# Patient Record
Sex: Male | Born: 1970 | Hispanic: No | Marital: Married | Smoking: Never smoker
Health system: Southern US, Community
[De-identification: ages and names within clinical notes are randomized; demographics above are authoritative.]

## PROBLEM LIST (undated history)

## (undated) DIAGNOSIS — J45909 Unspecified asthma, uncomplicated: Secondary | ICD-10-CM

---

## 2013-06-06 ENCOUNTER — Emergency Department (HOSPITAL_COMMUNITY): Payer: Managed Care, Other (non HMO)

## 2013-06-06 ENCOUNTER — Inpatient Hospital Stay (HOSPITAL_COMMUNITY): Payer: Managed Care, Other (non HMO)

## 2013-06-06 ENCOUNTER — Inpatient Hospital Stay (HOSPITAL_COMMUNITY)
Admission: EM | Admit: 2013-06-06 | Discharge: 2013-06-10 | DRG: 956 | Payer: Managed Care, Other (non HMO) | Attending: General Surgery | Admitting: General Surgery

## 2013-06-06 ENCOUNTER — Encounter (HOSPITAL_COMMUNITY): Payer: Self-pay | Admitting: Emergency Medicine

## 2013-06-06 DIAGNOSIS — M25569 Pain in unspecified knee: Secondary | ICD-10-CM | POA: Diagnosis present

## 2013-06-06 DIAGNOSIS — D62 Acute posthemorrhagic anemia: Secondary | ICD-10-CM | POA: Diagnosis not present

## 2013-06-06 DIAGNOSIS — S7290XA Unspecified fracture of unspecified femur, initial encounter for closed fracture: Secondary | ICD-10-CM

## 2013-06-06 DIAGNOSIS — S139XXA Sprain of joints and ligaments of unspecified parts of neck, initial encounter: Secondary | ICD-10-CM | POA: Diagnosis present

## 2013-06-06 DIAGNOSIS — S27899A Unspecified injury of other specified intrathoracic organs, initial encounter: Secondary | ICD-10-CM | POA: Diagnosis present

## 2013-06-06 DIAGNOSIS — Y92410 Unspecified street and highway as the place of occurrence of the external cause: Secondary | ICD-10-CM

## 2013-06-06 DIAGNOSIS — S36115A Moderate laceration of liver, initial encounter: Secondary | ICD-10-CM | POA: Diagnosis present

## 2013-06-06 DIAGNOSIS — S72309A Unspecified fracture of shaft of unspecified femur, initial encounter for closed fracture: Secondary | ICD-10-CM | POA: Diagnosis present

## 2013-06-06 DIAGNOSIS — S2249XA Multiple fractures of ribs, unspecified side, initial encounter for closed fracture: Secondary | ICD-10-CM | POA: Diagnosis present

## 2013-06-06 DIAGNOSIS — S27329A Contusion of lung, unspecified, initial encounter: Secondary | ICD-10-CM | POA: Diagnosis present

## 2013-06-06 DIAGNOSIS — S36113A Laceration of liver, unspecified degree, initial encounter: Secondary | ICD-10-CM | POA: Diagnosis present

## 2013-06-06 DIAGNOSIS — S2241XA Multiple fractures of ribs, right side, initial encounter for closed fracture: Secondary | ICD-10-CM | POA: Diagnosis present

## 2013-06-06 DIAGNOSIS — J45909 Unspecified asthma, uncomplicated: Secondary | ICD-10-CM | POA: Diagnosis present

## 2013-06-06 DIAGNOSIS — S7292XA Unspecified fracture of left femur, initial encounter for closed fracture: Secondary | ICD-10-CM | POA: Diagnosis present

## 2013-06-06 DIAGNOSIS — R52 Pain, unspecified: Secondary | ICD-10-CM | POA: Diagnosis present

## 2013-06-06 DIAGNOSIS — S27322A Contusion of lung, bilateral, initial encounter: Secondary | ICD-10-CM | POA: Diagnosis present

## 2013-06-06 HISTORY — DX: Unspecified asthma, uncomplicated: J45.909

## 2013-06-06 LAB — COMPREHENSIVE METABOLIC PANEL
ALBUMIN: 3.2 g/dL — AB (ref 3.5–5.2)
ALT: 205 U/L — ABNORMAL HIGH (ref 0–53)
AST: 231 U/L — ABNORMAL HIGH (ref 0–37)
Alkaline Phosphatase: 68 U/L (ref 39–117)
BILIRUBIN TOTAL: 0.5 mg/dL (ref 0.3–1.2)
BUN: 17 mg/dL (ref 6–23)
CHLORIDE: 102 meq/L (ref 96–112)
CO2: 22 mEq/L (ref 19–32)
CREATININE: 1.12 mg/dL (ref 0.50–1.35)
Calcium: 8.1 mg/dL — ABNORMAL LOW (ref 8.4–10.5)
GFR calc Af Amer: >90 mL/min (ref >90)
GFR calc non Af Amer: 80 mL/min — ABNORMAL LOW (ref >90)
GLUCOSE: 164 mg/dL — AB (ref 70–99)
POTASSIUM: 3.4 meq/L — AB (ref 3.7–5.3)
Sodium: 137 mEq/L (ref 137–147)
Total Protein: 5.8 g/dL — ABNORMAL LOW (ref 6.0–8.3)

## 2013-06-06 LAB — CBC WITH DIFFERENTIAL/PLATELET
BASOS ABS: 0 10*3/uL (ref 0.0–0.1)
BASOS PCT: 0 % (ref 0–1)
Basophils Absolute: 0 10*3/uL (ref 0.0–0.1)
Basophils Relative: 0 % (ref 0–1)
Eosinophils Absolute: 0.2 10*3/uL (ref 0.0–0.7)
Eosinophils Absolute: 0 10*3/uL (ref 0.0–0.7)
Eosinophils Relative: 1 % (ref 0–5)
Eosinophils Relative: 0 % (ref 0–5)
HCT: 35.4 % — ABNORMAL LOW (ref 39.0–52.0)
HEMATOCRIT: 39.8 % (ref 39.0–52.0)
Hemoglobin: 13.5 g/dL (ref 13.0–17.0)
Hemoglobin: 12 g/dL — ABNORMAL LOW (ref 13.0–17.0)
Lymphocytes Relative: 32 % (ref 12–46)
Lymphocytes Relative: 10 % — ABNORMAL LOW (ref 12–46)
Lymphs Abs: 4.1 10*3/uL — ABNORMAL HIGH (ref 0.7–4.0)
Lymphs Abs: 1.1 10*3/uL (ref 0.7–4.0)
MCH: 29.4 pg (ref 26.0–34.0)
MCH: 29.1 pg (ref 26.0–34.0)
MCHC: 33.9 g/dL (ref 30.0–36.0)
MCHC: 33.9 g/dL (ref 30.0–36.0)
MCV: 86.7 fL (ref 78.0–100.0)
MCV: 85.9 fL (ref 78.0–100.0)
MONO ABS: 0.3 10*3/uL (ref 0.1–1.0)
MONOS PCT: 2 % — AB (ref 3–12)
Monocytes Absolute: 0.8 10*3/uL (ref 0.1–1.0)
Monocytes Relative: 7 % (ref 3–12)
NEUTROS ABS: 9.3 10*3/uL — AB (ref 1.7–7.7)
NEUTROS PCT: 64 % (ref 43–77)
NEUTROS PCT: 83 % — AB (ref 43–77)
Neutro Abs: 8.2 10*3/uL — ABNORMAL HIGH (ref 1.7–7.7)
Platelets: 218 10*3/uL (ref 150–400)
Platelets: 194 10*3/uL (ref 150–400)
RBC: 4.59 MIL/uL (ref 4.22–5.81)
RBC: 4.12 MIL/uL — ABNORMAL LOW (ref 4.22–5.81)
RDW: 12.1 % (ref 11.5–15.5)
RDW: 12.1 % (ref 11.5–15.5)
WBC: 12.8 10*3/uL — ABNORMAL HIGH (ref 4.0–10.5)
WBC: 11.2 10*3/uL — AB (ref 4.0–10.5)

## 2013-06-06 LAB — I-STAT TROPONIN, ED
TROPONIN I, POC: 0.39 ng/mL — AB (ref 0.00–0.08)
TROPONIN I, POC: 0.35 ng/mL — AB (ref 0.00–0.08)

## 2013-06-06 LAB — SURGICAL PCR SCREEN
MRSA, PCR: NEGATIVE
STAPHYLOCOCCUS AUREUS: NEGATIVE

## 2013-06-06 LAB — PROTIME-INR
INR: 1.1 (ref 0.00–1.49)
PROTHROMBIN TIME: 14 s (ref 11.6–15.2)

## 2013-06-06 LAB — URINALYSIS, ROUTINE W REFLEX MICROSCOPIC
Bilirubin Urine: NEGATIVE
GLUCOSE, UA: 100 mg/dL — AB
Ketones, ur: NEGATIVE mg/dL
LEUKOCYTES UA: NEGATIVE
Nitrite: NEGATIVE
PROTEIN: NEGATIVE mg/dL
Specific Gravity, Urine: 1.018 (ref 1.005–1.030)
UROBILINOGEN UA: 0.2 mg/dL (ref 0.0–1.0)
pH: 6.5 (ref 5.0–8.0)

## 2013-06-06 LAB — TYPE AND SCREEN
ABO/RH(D): B POS
Antibody Screen: NEGATIVE

## 2013-06-06 LAB — URINE MICROSCOPIC-ADD ON

## 2013-06-06 LAB — ABO/RH: ABO/RH(D): B POS

## 2013-06-06 LAB — APTT: aPTT: 27 seconds (ref 24–37)

## 2013-06-06 MED ORDER — IOHEXOL 300 MG/ML  SOLN
100.0000 mL | Freq: Once | INTRAMUSCULAR | Status: AC | PRN
  Administered 2013-06-06: 100 mL via INTRAVENOUS

## 2013-06-06 MED ORDER — SODIUM CHLORIDE 0.9 % IJ SOLN: 9.0000 mL | INTRAMUSCULAR | Status: DC | PRN

## 2013-06-06 MED ORDER — SODIUM CHLORIDE 0.9 % IV BOLUS (SEPSIS)
1000.0000 mL | Freq: Once | INTRAVENOUS | Status: AC
  Administered 2013-06-06: 1000 mL via INTRAVENOUS

## 2013-06-06 MED ORDER — FENTANYL CITRATE 0.05 MG/ML IJ SOLN
INTRAMUSCULAR | Status: AC
  Filled 2013-06-06: qty 2

## 2013-06-06 MED ORDER — IOHEXOL 300 MG/ML  SOLN
80.0000 mL | Freq: Once | INTRAMUSCULAR | Status: AC | PRN
  Administered 2013-06-06: 70 mL via INTRAVENOUS

## 2013-06-06 MED ORDER — HYDROMORPHONE 0.3 MG/ML IV SOLN
INTRAVENOUS | Status: DC
  Administered 2013-06-06: 0.9 mg via INTRAVENOUS
  Administered 2013-06-06: 15:00:00 via INTRAVENOUS
  Administered 2013-06-06: 1.2 mg via INTRAVENOUS
  Administered 2013-06-07: 0.3 mg via INTRAVENOUS
  Administered 2013-06-07: 0.9 mg via INTRAVENOUS
  Administered 2013-06-07: 3.84 mg via INTRAVENOUS
  Administered 2013-06-07 (×2): 0.6 mg via INTRAVENOUS
  Administered 2013-06-07: 11:00:00 via INTRAVENOUS
  Administered 2013-06-08: 0.9 mg via INTRAVENOUS
  Administered 2013-06-08: 0.495 mg via INTRAVENOUS
  Administered 2013-06-08: 3.9 mg via INTRAVENOUS
  Filled 2013-06-06 (×3): qty 25

## 2013-06-06 MED ORDER — FENTANYL CITRATE 0.05 MG/ML IJ SOLN
100.0000 ug | Freq: Once | INTRAMUSCULAR | Status: AC
  Administered 2013-06-06: 100 ug via INTRAVENOUS

## 2013-06-06 MED ORDER — HYDROMORPHONE HCL PF 1 MG/ML IJ SOLN
1.0000 mg | INTRAMUSCULAR | Status: DC | PRN
  Filled 2013-06-06: qty 1

## 2013-06-06 MED ORDER — FENTANYL CITRATE 0.05 MG/ML IJ SOLN
100.0000 ug | Freq: Once | INTRAMUSCULAR | Status: AC
  Administered 2013-06-06: 100 ug via INTRAVENOUS
  Filled 2013-06-06: qty 2

## 2013-06-06 MED ORDER — NALOXONE HCL 0.4 MG/ML IJ SOLN: 0.4000 mg | INTRAMUSCULAR | Status: DC | PRN

## 2013-06-06 NOTE — ED Notes (Signed)
Patient returned from CT

## 2013-06-06 NOTE — ED Notes (Signed)
Pt placed on 2L 02, pt's 02 sats reading 92% on RA. Pt's 02 sats reading 100% on 2L 02.

## 2013-06-06 NOTE — ED Notes (Signed)
Patient transported to CT 

## 2013-06-06 NOTE — Progress Notes (Signed)
Patient ID: Jonathan SaversMohammed Nadeem Amenta, male   DOB: 05/15/1970, 43 y.o.   MRN: [id number] CT chest reviewed. Also has R rib FXs 3-6. Violeta GelinasBurke Charleen Madera, MD, MPH, FACS Trauma: [phone] General Surgery: [phone]

## 2013-06-06 NOTE — Progress Notes (Signed)
Chaplains received referral to follow up with patient and his family from overnight chaplain. Chaplains providing spiritual and emotional support to patient's brothers who arrived earlier today from [place]. Also, following patient's family members also admitted for medical treatment. [facility]. Will continue to monitor, refer to unit chaplain for follow up. Chaplain Clarita LeberCaroline Crawford was also providing care.   Maurene CapesHillary D Irusta [phone]

## 2013-06-06 NOTE — Progress Notes (Signed)
Chaplain responded to page from nurse that pt was looking for update regarding his wife.  Chaplain could not provide that information but did visit with the pt.  Pt seemed in pain but was responsive to questioning. Chaplain assessed the situation but there was little room for spiritual support in this circumstance.  However, chaplain services will be available in the future if needed.
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
## 2013-06-06 NOTE — Progress Notes (Signed)
Surgery scheduled for 3/10 at 0330pm
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
## 2013-06-06 NOTE — ED Notes (Signed)
Results of troponin given to Dr. Wentz 

## 2013-06-06 NOTE — ED Notes (Signed)
Pt back in room.
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
## 2013-06-06 NOTE — ED Notes (Signed)
Dr. Yates at BS 

## 2013-06-06 NOTE — ED Notes (Signed)
Dr. Wentz at bedside. 

## 2013-06-06 NOTE — Progress Notes (Signed)
Responded to page from nurse indicating that patient sister Leward Quan(Afshan Oriley)  was at bedside and want to locate and see all her family. I visited with patient but patient was in and out with sleep. Patient is scheduled for surgery on tomorrow. Patient's Older brother requested that nurse inform doctor that Mr. Marnette Burgessziz  Will need a note for work to inform Pt.'s job of his whereabout and medical status.  Made calls to all units of other family and arranged visits for family. Escorted  Pt.'s sister and family to visit with other pt.'s.  Provided emotional support, presence, empathic listening and guidance  to pt.'s family. Promoted information sharing between staff and family. Supported Haematologiststaff. Will pass on to unit Chaplain in A.M. For continued support. Will follow as needed.  06/06/13 2300  Clinical Encounter Type  Visited With Patient;Patient and family together;Health care provider  Visit Type Follow-up;Social support;Pre-op;Other (Comment)  Referral From (Support to sister)  Spiritual Encounters  Spiritual Needs Emotional (Guidance)  Stress Factors  Patient Stress Factors None identified  Family Stress Factors Exhausted;Family relationships;Health changes;Major life changes  Venida JarvisWatlington, Myrl Bynum, [place], pager [phone]
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
## 2013-06-06 NOTE — ED Notes (Signed)
Pt transported to CT with this RN 
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
## 2013-06-06 NOTE — Progress Notes (Signed)
UR completed.  Mariko Nowakowski, RN BSN [facility] CCM Trauma/Neuro ICU Case Manager [phone]  

## 2013-06-07 ENCOUNTER — Inpatient Hospital Stay (HOSPITAL_COMMUNITY): Payer: Managed Care, Other (non HMO)

## 2013-06-07 ENCOUNTER — Encounter (HOSPITAL_COMMUNITY): Payer: Self-pay | Admitting: Anesthesiology

## 2013-06-07 ENCOUNTER — Encounter (HOSPITAL_COMMUNITY): Payer: Managed Care, Other (non HMO) | Admitting: Anesthesiology

## 2013-06-07 ENCOUNTER — Inpatient Hospital Stay (HOSPITAL_COMMUNITY): Payer: Managed Care, Other (non HMO) | Admitting: Anesthesiology

## 2013-06-07 ENCOUNTER — Encounter (HOSPITAL_COMMUNITY)
Admission: EM | Disposition: A | Discharge status: Other Acute Inpatient Hospital | Payer: Self-pay | Source: Home / Self Care

## 2013-06-07 DIAGNOSIS — R52 Pain, unspecified: Secondary | ICD-10-CM | POA: Diagnosis not present

## 2013-06-07 DIAGNOSIS — S72309A Unspecified fracture of shaft of unspecified femur, initial encounter for closed fracture: Secondary | ICD-10-CM | POA: Diagnosis not present

## 2013-06-07 HISTORY — PX: FEMUR IM NAIL: SHX1597

## 2013-06-07 LAB — COMPREHENSIVE METABOLIC PANEL
ALT: 133 U/L — ABNORMAL HIGH (ref 0–53)
AST: 75 U/L — AB (ref 0–37)
Albumin: 2.9 g/dL — ABNORMAL LOW (ref 3.5–5.2)
Alkaline Phosphatase: 59 U/L (ref 39–117)
BUN: 11 mg/dL (ref 6–23)
CALCIUM: 8.2 mg/dL — AB (ref 8.4–10.5)
CO2: 23 mEq/L (ref 19–32)
CREATININE: 0.98 mg/dL (ref 0.50–1.35)
Chloride: 101 mEq/L (ref 96–112)
GFR calc Af Amer: >90 mL/min (ref >90)
GFR calc non Af Amer: >90 mL/min (ref >90)
Glucose, Bld: 109 mg/dL — ABNORMAL HIGH (ref 70–99)
Potassium: 3.9 mEq/L (ref 3.7–5.3)
Sodium: 135 mEq/L — ABNORMAL LOW (ref 137–147)
TOTAL PROTEIN: 5.6 g/dL — AB (ref 6.0–8.3)
Total Bilirubin: 1.5 mg/dL — ABNORMAL HIGH (ref 0.3–1.2)

## 2013-06-07 LAB — CBC
HCT: 36.3 % — ABNORMAL LOW (ref 39.0–52.0)
HCT: 34.4 % — ABNORMAL LOW (ref 39.0–52.0)
HEMOGLOBIN: 12.1 g/dL — AB (ref 13.0–17.0)
Hemoglobin: 11.5 g/dL — ABNORMAL LOW (ref 13.0–17.0)
MCH: 29.2 pg (ref 26.0–34.0)
MCH: 29.1 pg (ref 26.0–34.0)
MCHC: 33.3 g/dL (ref 30.0–36.0)
MCHC: 33.4 g/dL (ref 30.0–36.0)
MCV: 87.7 fL (ref 78.0–100.0)
MCV: 87.1 fL (ref 78.0–100.0)
PLATELETS: 143 10*3/uL — AB (ref 150–400)
Platelets: 156 10*3/uL (ref 150–400)
RBC: 4.14 MIL/uL — ABNORMAL LOW (ref 4.22–5.81)
RBC: 3.95 MIL/uL — ABNORMAL LOW (ref 4.22–5.81)
RDW: 12.3 % (ref 11.5–15.5)
RDW: 12 % (ref 11.5–15.5)
WBC: 8.4 10*3/uL (ref 4.0–10.5)
WBC: 9.1 10*3/uL (ref 4.0–10.5)

## 2013-06-07 SURGERY — INSERTION, INTRAMEDULLARY ROD, FEMUR, RETROGRADE
Anesthesia: General | Site: Leg Upper | Laterality: Left

## 2013-06-07 MED ORDER — ACETAMINOPHEN 160 MG/5ML PO SOLN
325.0000 mg | ORAL | Status: DC | PRN
  Filled 2013-06-07: qty 20.3

## 2013-06-07 MED ORDER — METOCLOPRAMIDE HCL 5 MG PO TABS
5.0000 mg | ORAL_TABLET | Freq: Three times a day (TID) | ORAL | Status: DC | PRN
  Filled 2013-06-07: qty 2

## 2013-06-07 MED ORDER — LIDOCAINE HCL (CARDIAC) 20 MG/ML IV SOLN
INTRAVENOUS | Status: DC | PRN
  Administered 2013-06-07: 70 mg via INTRAVENOUS

## 2013-06-07 MED ORDER — ONDANSETRON HCL 4 MG/2ML IJ SOLN: 4.0000 mg | Freq: Four times a day (QID) | INTRAMUSCULAR | Status: DC | PRN

## 2013-06-07 MED ORDER — ROPIVACAINE HCL 5 MG/ML IJ SOLN
INTRAMUSCULAR | Status: DC | PRN
  Administered 2013-06-07: 25 mL via PERINEURAL

## 2013-06-07 MED ORDER — CEFAZOLIN SODIUM-DEXTROSE 2-3 GM-% IV SOLR
INTRAVENOUS | Status: DC | PRN
  Administered 2013-06-07: 2 g via INTRAVENOUS

## 2013-06-07 MED ORDER — MIDAZOLAM HCL 2 MG/2ML IJ SOLN
INTRAMUSCULAR | Status: AC
  Filled 2013-06-07: qty 2

## 2013-06-07 MED ORDER — ONDANSETRON HCL 4 MG/2ML IJ SOLN
INTRAMUSCULAR | Status: DC | PRN
  Administered 2013-06-07: 4 mg via INTRAVENOUS

## 2013-06-07 MED ORDER — LACTATED RINGERS IV SOLN
INTRAVENOUS | Status: DC | PRN
  Administered 2013-06-07 (×3): via INTRAVENOUS

## 2013-06-07 MED ORDER — PANTOPRAZOLE SODIUM 40 MG PO TBEC
40.0000 mg | DELAYED_RELEASE_TABLET | Freq: Every day | ORAL | Status: DC
Start: 2013-06-07 — End: 2013-06-08
  Administered 2013-06-08: 40 mg via ORAL

## 2013-06-07 MED ORDER — ROCURONIUM BROMIDE 100 MG/10ML IV SOLN
INTRAVENOUS | Status: DC | PRN
  Administered 2013-06-07: 50 mg via INTRAVENOUS

## 2013-06-07 MED ORDER — PANTOPRAZOLE SODIUM 40 MG IV SOLR
40.0000 mg | Freq: Every day | INTRAVENOUS | Status: DC
  Administered 2013-06-07: 40 mg via INTRAVENOUS
  Filled 2013-06-07 (×2): qty 40

## 2013-06-07 MED ORDER — LACTATED RINGERS IV SOLN
INTRAVENOUS | Status: DC
  Administered 2013-06-07: 15:00:00 via INTRAVENOUS

## 2013-06-07 MED ORDER — FENTANYL CITRATE 0.05 MG/ML IJ SOLN
INTRAMUSCULAR | Status: DC | PRN
  Administered 2013-06-07: 100 ug via INTRAVENOUS

## 2013-06-07 MED ORDER — PHENYLEPHRINE HCL 10 MG/ML IJ SOLN
INTRAMUSCULAR | Status: DC | PRN
  Administered 2013-06-07: 80 ug via INTRAVENOUS

## 2013-06-07 MED ORDER — SODIUM CHLORIDE 0.9 % IV BOLUS (SEPSIS)
500.0000 mL | Freq: Once | INTRAVENOUS | Status: AC
  Administered 2013-06-07: 500 mL via INTRAVENOUS

## 2013-06-07 MED ORDER — FENTANYL CITRATE 0.05 MG/ML IJ SOLN
INTRAMUSCULAR | Status: AC
  Filled 2013-06-07: qty 5

## 2013-06-07 MED ORDER — NEOSTIGMINE METHYLSULFATE 1 MG/ML IJ SOLN
INTRAMUSCULAR | Status: DC | PRN
  Administered 2013-06-07: 4 mg via INTRAVENOUS

## 2013-06-07 MED ORDER — ASPIRIN 325 MG PO TABS
325.0000 mg | ORAL_TABLET | Freq: Every day | ORAL | Status: DC
  Administered 2013-06-08 – 2013-06-10 (×3): 325 mg via ORAL
  Filled 2013-06-07 (×3): qty 1

## 2013-06-07 MED ORDER — OXYCODONE HCL 5 MG/5ML PO SOLN: 5.0000 mg | Freq: Once | ORAL | Status: DC | PRN

## 2013-06-07 MED ORDER — FENTANYL CITRATE 0.05 MG/ML IJ SOLN
25.0000 ug | INTRAMUSCULAR | Status: DC | PRN
  Administered 2013-06-07 (×2): 25 ug via INTRAVENOUS

## 2013-06-07 MED ORDER — POTASSIUM CHLORIDE 2 MEQ/ML IV SOLN
INTRAVENOUS | Status: DC
  Administered 2013-06-07 – 2013-06-08 (×2): via INTRAVENOUS
  Filled 2013-06-07 (×5): qty 1000

## 2013-06-07 MED ORDER — ONDANSETRON HCL 4 MG/2ML IJ SOLN
4.0000 mg | Freq: Four times a day (QID) | INTRAMUSCULAR | Status: DC | PRN
Start: 2013-06-07 — End: 2013-06-10

## 2013-06-07 MED ORDER — FENTANYL CITRATE 0.05 MG/ML IJ SOLN
INTRAMUSCULAR | Status: AC
  Filled 2013-06-07: qty 2

## 2013-06-07 MED ORDER — OXYCODONE HCL 5 MG PO TABS: 5.0000 mg | ORAL_TABLET | Freq: Once | ORAL | Status: DC | PRN

## 2013-06-07 MED ORDER — GLYCOPYRROLATE 0.2 MG/ML IJ SOLN
INTRAMUSCULAR | Status: DC | PRN
  Administered 2013-06-07: .7 mg via INTRAVENOUS

## 2013-06-07 MED ORDER — CEFAZOLIN SODIUM-DEXTROSE 2-3 GM-% IV SOLR: 2.0000 g | Freq: Once | INTRAVENOUS | Status: DC

## 2013-06-07 MED ORDER — ONDANSETRON HCL 4 MG PO TABS: 4.0000 mg | ORAL_TABLET | Freq: Four times a day (QID) | ORAL | Status: DC | PRN

## 2013-06-07 MED ORDER — POTASSIUM CHLORIDE 2 MEQ/ML IV SOLN: INTRAVENOUS | Status: DC

## 2013-06-07 MED ORDER — OXYCODONE-ACETAMINOPHEN 5-325 MG PO TABS: 1.0000 | ORAL_TABLET | ORAL | Status: DC | PRN

## 2013-06-07 MED ORDER — METHOCARBAMOL 100 MG/ML IJ SOLN
500.0000 mg | Freq: Four times a day (QID) | INTRAVENOUS | Status: DC | PRN
  Filled 2013-06-07: qty 5

## 2013-06-07 MED ORDER — METOCLOPRAMIDE HCL 5 MG/ML IJ SOLN
5.0000 mg | Freq: Three times a day (TID) | INTRAMUSCULAR | Status: DC | PRN
  Filled 2013-06-07: qty 2

## 2013-06-07 MED ORDER — ONDANSETRON HCL 4 MG/2ML IJ SOLN: 4.0000 mg | Freq: Once | INTRAMUSCULAR | Status: DC | PRN

## 2013-06-07 MED ORDER — DIPHENHYDRAMINE HCL 50 MG/ML IJ SOLN: 12.5000 mg | Freq: Four times a day (QID) | INTRAMUSCULAR | Status: DC | PRN

## 2013-06-07 MED ORDER — HYDROCODONE-ACETAMINOPHEN 5-325 MG PO TABS
1.0000 | ORAL_TABLET | ORAL | Status: DC | PRN
  Administered 2013-06-08: 2 via ORAL
  Filled 2013-06-07: qty 2

## 2013-06-07 MED ORDER — PROPOFOL 10 MG/ML IV BOLUS
INTRAVENOUS | Status: DC | PRN
  Administered 2013-06-07: 140 mg via INTRAVENOUS

## 2013-06-07 MED ORDER — DIPHENHYDRAMINE HCL 12.5 MG/5ML PO ELIX
12.5000 mg | ORAL_SOLUTION | Freq: Four times a day (QID) | ORAL | Status: DC | PRN
  Filled 2013-06-07: qty 5

## 2013-06-07 MED ORDER — PROPOFOL 10 MG/ML IV BOLUS
INTRAVENOUS | Status: AC
  Filled 2013-06-07: qty 20

## 2013-06-07 MED ORDER — ACETAMINOPHEN 325 MG PO TABS: 325.0000 mg | ORAL_TABLET | ORAL | Status: DC | PRN

## 2013-06-07 MED ORDER — KCL-LACTATED RINGERS-D5W 20 MEQ/L IV SOLN
INTRAVENOUS | Status: DC
  Filled 2013-06-07 (×2): qty 1000

## 2013-06-07 MED ORDER — METHOCARBAMOL 500 MG PO TABS
500.0000 mg | ORAL_TABLET | Freq: Four times a day (QID) | ORAL | Status: DC | PRN
  Administered 2013-06-08: 500 mg via ORAL
  Filled 2013-06-07: qty 1

## 2013-06-07 SURGICAL SUPPLY — 61 items
BANDAGE ELASTIC 4 VELCRO ST LF (GAUZE/BANDAGES/DRESSINGS) ×3 IMPLANT
BANDAGE ELASTIC 6 VELCRO ST LF (GAUZE/BANDAGES/DRESSINGS) ×3 IMPLANT
BANDAGE ESMARK 6X9 LF (GAUZE/BANDAGES/DRESSINGS) ×1 IMPLANT
BANDAGE GAUZE ELAST BULKY 4 IN (GAUZE/BANDAGES/DRESSINGS) ×3 IMPLANT
BIT DRILL CALIBRATED 4.3MMX365 (DRILL) ×1 IMPLANT
BIT DRILL CROWE PNT TWST 4.5MM (DRILL) ×1 IMPLANT
BLADE SURG 15 STRL LF DISP TIS (BLADE) ×1 IMPLANT
BLADE SURG 15 STRL SS (BLADE) ×2
BLADE SURG ROTATE 9660 (MISCELLANEOUS) IMPLANT
BNDG COHESIVE 6X5 TAN STRL LF (GAUZE/BANDAGES/DRESSINGS) ×3 IMPLANT
BNDG ESMARK 6X9 LF (GAUZE/BANDAGES/DRESSINGS) ×3
COVER SURGICAL LIGHT HANDLE (MISCELLANEOUS) ×3 IMPLANT
CUFF TOURNIQUET SINGLE 34IN LL (TOURNIQUET CUFF) IMPLANT
CUFF TOURNIQUET SINGLE 44IN (TOURNIQUET CUFF) IMPLANT
DRAPE C-ARM 42X72 X-RAY (DRAPES) ×3 IMPLANT
DRAPE ORTHO SPLIT 77X108 STRL (DRAPES) ×4
DRAPE PROXIMA HALF (DRAPES) ×3 IMPLANT
DRAPE SURG ORHT 6 SPLT 77X108 (DRAPES) ×2 IMPLANT
DRAPE U-SHAPE 47X51 STRL (DRAPES) ×3 IMPLANT
DRILL CALIBRATED 4.3MMX365 (DRILL) ×3
DRILL CROWE POINT TWIST 4.5MM (DRILL) ×3
DURAPREP 26ML APPLICATOR (WOUND CARE) ×3 IMPLANT
ELECT REM PT RETURN 9FT ADLT (ELECTROSURGICAL) ×3
ELECTRODE REM PT RTRN 9FT ADLT (ELECTROSURGICAL) ×1 IMPLANT
FACESHIELD LNG OPTICON STERILE (SAFETY) ×6 IMPLANT
GAUZE XEROFORM 5X9 LF (GAUZE/BANDAGES/DRESSINGS) ×3 IMPLANT
GLOVE BIOGEL PI IND STRL 7.5 (GLOVE) ×1 IMPLANT
GLOVE BIOGEL PI IND STRL 8 (GLOVE) ×1 IMPLANT
GLOVE BIOGEL PI INDICATOR 7.5 (GLOVE) ×2
GLOVE BIOGEL PI INDICATOR 8 (GLOVE) ×2
GLOVE ECLIPSE 7.0 STRL STRAW (GLOVE) ×3 IMPLANT
GLOVE ORTHO TXT STRL SZ7.5 (GLOVE) ×3 IMPLANT
GOWN STRL REUS W/ TWL LRG LVL3 (GOWN DISPOSABLE) ×2 IMPLANT
GOWN STRL REUS W/ TWL XL LVL3 (GOWN DISPOSABLE) ×1 IMPLANT
GOWN STRL REUS W/TWL LRG LVL3 (GOWN DISPOSABLE) ×4
GOWN STRL REUS W/TWL XL LVL3 (GOWN DISPOSABLE) ×2
GUIDEPIN 3.2X17.5 THRD DISP (PIN) ×3 IMPLANT
GUIDEWIRE BEAD TIP (WIRE) ×3 IMPLANT
KIT BASIN OR (CUSTOM PROCEDURE TRAY) ×3 IMPLANT
KIT ROOM TURNOVER OR (KITS) ×3 IMPLANT
MANIFOLD NEPTUNE II (INSTRUMENTS) ×3 IMPLANT
NAIL FEM RETRO 9X360 (Nail) ×3 IMPLANT
NS IRRIG 1000ML POUR BTL (IV SOLUTION) IMPLANT
PACK GENERAL/GYN (CUSTOM PROCEDURE TRAY) ×3 IMPLANT
PAD ARMBOARD 7.5X6 YLW CONV (MISCELLANEOUS) ×6 IMPLANT
SCREW CORT TI DBL LEAD 5X26 (Screw) ×3 IMPLANT
SCREW CORT TI DBL LEAD 5X56 (Screw) ×3 IMPLANT
SCREW CORT TI DBL LEAD 5X65 (Screw) ×6 IMPLANT
SPONGE GAUZE 4X4 12PLY (GAUZE/BANDAGES/DRESSINGS) ×6 IMPLANT
SPONGE GAUZE 4X4 12PLY STER LF (GAUZE/BANDAGES/DRESSINGS) ×3 IMPLANT
STAPLER VISISTAT 35W (STAPLE) ×3 IMPLANT
STOCKINETTE IMPERVIOUS LG (DRAPES) ×3 IMPLANT
SUT VIC AB 0 CT1 27 (SUTURE) ×2
SUT VIC AB 0 CT1 27XBRD ANBCTR (SUTURE) ×1 IMPLANT
SUT VIC AB 2-0 CT1 27 (SUTURE) ×2
SUT VIC AB 2-0 CT1 TAPERPNT 27 (SUTURE) ×1 IMPLANT
TAPE CLOTH SURG 4X10 WHT LF (GAUZE/BANDAGES/DRESSINGS) ×3 IMPLANT
TOWEL OR 17X24 6PK STRL BLUE (TOWEL DISPOSABLE) ×3 IMPLANT
TOWEL OR 17X26 10 PK STRL BLUE (TOWEL DISPOSABLE) ×3 IMPLANT
TRAY FOLEY CATH 16FRSI W/METER (SET/KITS/TRAYS/PACK) IMPLANT
WATER STERILE IRR 1000ML POUR (IV SOLUTION) ×3 IMPLANT

## 2013-06-07 NOTE — Anesthesia Preprocedure Evaluation (Addendum)
Anesthesia Evaluation  Patient identified by MRN, date of birth, ID band Patient awake    Reviewed: Allergy & Precautions, H&P , NPO status , Patient's Chart, lab work & pertinent test results  History of Anesthesia Complications Negative for: history of anesthetic complications  Airway Mallampati: II TM Distance: >3 FB     Dental  (+) Teeth Intact   Pulmonary neg shortness of breath, asthma , neg COPD Rib fractures right 3-6, no ptx breath sounds clear to auscultation- rhonchi        Cardiovascular - angina- Past MI, - Cardiac Stents and - CHF negative cardio ROS  Rhythm:Regular Rate:Normal     Neuro/Psych negative neurological ROS  negative psych ROS   GI/Hepatic negative GI ROS, Neg liver ROS,   Endo/Other  negative endocrine ROS  Renal/GU negative Renal ROS     Musculoskeletal   Abdominal   Peds  Hematology negative hematology ROS (+)   Anesthesia Other Findings   Reproductive/Obstetrics                          Anesthesia Physical Anesthesia Plan  ASA: II  Anesthesia Plan: General and Regional   Post-op Pain Management:    Induction: Intravenous  Airway Management Planned: Oral ETT  Additional Equipment: None  Intra-op Plan:   Post-operative Plan: Extubation in OR  Informed Consent: I have reviewed the patients History and Physical, chart, labs and discussed the procedure including the risks, benefits and alternatives for the proposed anesthesia with the patient or authorized representative who has indicated his/her understanding and acceptance.   Dental advisory given  Plan Discussed with: CRNA and Surgeon  Anesthesia Plan Comments:         Anesthesia Quick Evaluation

## 2013-06-07 NOTE — Transfer of Care (Signed)
Immediate Anesthesia Transfer of Care Note  Patient: Jonathan Bradshaw  Procedure(s) Performed: Procedure(s): INTRAMEDULLARY (IM) RETROGRADE FEMORAL NAILING (Left)  Patient Location: PACU  Anesthesia Type:General  Level of Consciousness: awake and alert   Airway & Oxygen Therapy: Patient Spontanous Breathing and Patient connected to nasal cannula oxygen  Post-op Assessment: Report given to PACU RN and Post -op Vital signs reviewed and stable  Post vital signs: Reviewed and stable  Complications: No apparent anesthesia complications

## 2013-06-07 NOTE — Progress Notes (Signed)
Chaplain followed up with pt's brothers. Brothers stated that their other brother who was discharged yesterday is having a lot of pain and that they were unable to fill his RX due to financial concerns. Chaplain conferred with RN who called case management. Chaplain spoke to spiritual care director about using discretionary fund, which is available if they need it. Chaplain will follow up as necessary.
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
## 2013-06-07 NOTE — Progress Notes (Signed)
Subjective:   Procedure(s) (LRB): INTRAMEDULLARY (IM) RETROGRADE FEMORAL NAILING (Left) Patient reports pain as moderate at thigh fracture site. Denies neck pain.    Objective: Vital signs in last 24 hours: Temp:  [98.9 F (37.2 C)-100.4 F (38 C)] 98.9 F (37.2 C) (03/11 0400) Pulse Rate:  [64-98] 68 (03/11 0700) Resp:  [11-18] 17 (03/11 0746) BP: (82-130)/(49-76) 97/49 mmHg (03/11 0700) SpO2:  [94 %-100 %] 99 % (03/11 0746) Weight:  [74 kg (163 lb 2.3 oz)] 74 kg (163 lb 2.3 oz) (03/10 1337)  Intake/Output from previous day: 03/10 0701 - 03/11 0700 In: 240 [P.O.:240] Out: 2875 [Urine:2875] Intake/Output this shift: Total I/O In: 12 [I.V.:12] Out: -    Recent Labs  06/06/13 0220 06/06/13 1119  HGB 13.5 12.0*    Recent Labs  06/06/13 0220 06/06/13 1119  WBC 12.8* 11.2*  RBC 4.59 4.12*  HCT 39.8 35.4*  PLT 218 194    Recent Labs  06/06/13 0220  NA 137  K 3.4*  CL 102  CO2 22  BUN 17  CREATININE 1.12  GLUCOSE 164*  CALCIUM 8.1*    Recent Labs  06/06/13 0220  INR 1.10    Neurologically intact  Assessment/Plan:   Procedure(s) (LRB): INTRAMEDULLARY (IM) RETROGRADE FEMORAL NAILING (Left) Plan:    Surgery today at about 3 PM for left femoral nail.     No cervical pain with ROM , good flexion and extension and rotation without pain. CT cervical reviewed and normal.  D/C Collar.    Discussed procedure with patient and planned retrograde femoral nail of left femur. He understands and agrees to proceed.  Juquan Reznick C 06/07/2013, 8:14 AM

## 2013-06-07 NOTE — Anesthesia Procedure Notes (Addendum)
Anesthesia Regional Block:  Femoral nerve block  Pre-Anesthetic Checklist: ,, timeout performed, Correct Patient, Correct Site, Correct Laterality, Correct Procedure, Correct Position, site marked, Risks and benefits discussed,  Surgical consent,  Pre-op evaluation,  At surgeon's request and post-op pain management  Laterality: Lower and Left  Prep: chloraprep       Needles:  Injection technique: Single-shot  Needle Type: Echogenic Needle          Additional Needles:  Procedures: ultrasound guided (picture in chart) Femoral nerve block Narrative:  Start time: 06/07/2013 3:21 PM End time: 06/07/2013 3:27 PM Injection made incrementally with aspirations every 5 mL.  Performed by: Personally  Anesthesiologist: Moser  Additional Notes: H+P and labs reviewed, risks and benefits discussed with patient, procedure tolerated well without complications   Date/Time: 06/07/2013 3:31 PM Performed by: Gwenyth AllegraADAMI, Jazmene Racz Pre-anesthesia Checklist: Patient identified, Timeout performed, Emergency Drugs available, Suction available and Patient being monitored Patient Re-evaluated:Patient Re-evaluated prior to inductionOxygen Delivery Method: Circle system utilized Preoxygenation: Pre-oxygenation with 100% oxygen Intubation Type: IV induction Ventilation: Mask ventilation without difficulty Laryngoscope Size: Mac and 4 Grade View: Grade I Tube type: Oral Tube size: 7.5 mm Number of attempts: 1 Airway Equipment and Method: Stylet Placement Confirmation: ETT inserted through vocal cords under direct vision,  breath sounds checked- equal and bilateral and positive ETCO2 Secured at: 21 cm Tube secured with: Tape Dental Injury: Teeth and Oropharynx as per pre-operative assessment
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
## 2013-06-07 NOTE — Anesthesia Postprocedure Evaluation (Signed)
Anesthesia Post Note  Patient: Jonathan SaversMohammed Nadeem Cockrell  Procedure(s) Performed: Procedure(s) (LRB): INTRAMEDULLARY (IM) RETROGRADE FEMORAL NAILING (Left)  Anesthesia type: General  Patient location: PACU  Post pain: Pain level controlled and Adequate analgesia  Post assessment: Post-op Vital signs reviewed, Patient's Cardiovascular Status Stable, Respiratory Function Stable, Patent Airway and Pain level controlled  Last Vitals:  Filed Vitals:   06/07/13 1745  BP: 128/72  Pulse: 99  Temp:   Resp: 19    Post vital signs: Reviewed and stable  Level of consciousness: awake, alert  and oriented  Complications: No apparent anesthesia complications

## 2013-06-07 NOTE — Progress Notes (Signed)
Pulse ox decreased to 85% when sleeping, placed on O2 2L via nasal cannula with pulse ox increasing to 95-97%. Also placed on continuous pulse ox monitor.

## 2013-06-07 NOTE — Preoperative (Signed)
Beta Blockers   Reason not to administer Beta Blockers:Not Applicable 

## 2013-06-08 ENCOUNTER — Inpatient Hospital Stay (HOSPITAL_COMMUNITY): Payer: Managed Care, Other (non HMO)

## 2013-06-08 ENCOUNTER — Encounter (HOSPITAL_COMMUNITY): Payer: Self-pay | Admitting: Orthopaedic Surgery

## 2013-06-08 LAB — CBC
HEMATOCRIT: 30.6 % — AB (ref 39.0–52.0)
HEMOGLOBIN: 10.3 g/dL — AB (ref 13.0–17.0)
MCH: 29.1 pg (ref 26.0–34.0)
MCHC: 33.7 g/dL (ref 30.0–36.0)
MCV: 86.4 fL (ref 78.0–100.0)
Platelets: 131 10*3/uL — ABNORMAL LOW (ref 150–400)
RBC: 3.54 MIL/uL — ABNORMAL LOW (ref 4.22–5.81)
RDW: 11.9 % (ref 11.5–15.5)
WBC: 9.2 10*3/uL (ref 4.0–10.5)

## 2013-06-08 LAB — BASIC METABOLIC PANEL
BUN: 10 mg/dL (ref 6–23)
CHLORIDE: 102 meq/L (ref 96–112)
CO2: 26 meq/L (ref 19–32)
CREATININE: 0.96 mg/dL (ref 0.50–1.35)
Calcium: 7.7 mg/dL — ABNORMAL LOW (ref 8.4–10.5)
GFR calc non Af Amer: >90 mL/min (ref >90)
Glucose, Bld: 128 mg/dL — ABNORMAL HIGH (ref 70–99)
POTASSIUM: 4 meq/L (ref 3.7–5.3)
SODIUM: 138 meq/L (ref 137–147)

## 2013-06-08 MED ORDER — HYDROMORPHONE HCL PF 1 MG/ML IJ SOLN: 1.0000 mg | INTRAMUSCULAR | Status: DC | PRN

## 2013-06-08 MED ORDER — POLYETHYLENE GLYCOL 3350 17 G PO PACK
17.0000 g | PACK | Freq: Every day | ORAL | Status: DC
  Administered 2013-06-08 – 2013-06-10 (×3): 17 g via ORAL
  Filled 2013-06-08 (×3): qty 1

## 2013-06-08 MED ORDER — DOCUSATE SODIUM 100 MG PO CAPS
100.0000 mg | ORAL_CAPSULE | Freq: Two times a day (BID) | ORAL | Status: DC
  Administered 2013-06-08 – 2013-06-10 (×4): 100 mg via ORAL
  Filled 2013-06-08 (×4): qty 1

## 2013-06-08 MED ORDER — OXYCODONE HCL 5 MG PO TABS
10.0000 mg | ORAL_TABLET | ORAL | Status: DC | PRN
  Administered 2013-06-09 (×3): 10 mg via ORAL
  Administered 2013-06-10: 15 mg via ORAL
  Administered 2013-06-10: 20 mg via ORAL
  Administered 2013-06-10: 15 mg via ORAL
  Filled 2013-06-08: qty 2
  Filled 2013-06-08: qty 4
  Filled 2013-06-08 (×3): qty 2
  Filled 2013-06-08 (×2): qty 3

## 2013-06-08 MED ORDER — METHOCARBAMOL 750 MG PO TABS
1500.0000 mg | ORAL_TABLET | Freq: Four times a day (QID) | ORAL | Status: DC
  Administered 2013-06-08 – 2013-06-10 (×7): 1500 mg via ORAL
  Filled 2013-06-08 (×10): qty 2
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
## 2013-06-08 NOTE — Progress Notes (Signed)
[facility] Report called to Aurea GraffJoan, [place]. All VSS, patient alert and oriented. Family at bedside.
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
## 2013-06-08 NOTE — Progress Notes (Signed)
Doing better on IS. Good pain control. I also spoke to his brother. Patient examined and I agree with the assessment and plan  Violeta GelinasBurke Rubie Ficco, MD, MPH, FACS Trauma: [phone] General Surgery: [phone]  06/08/2013 12:10 PM
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
## 2013-06-09 LAB — CBC
HCT: 29.5 % — ABNORMAL LOW (ref 39.0–52.0)
HEMOGLOBIN: 10.1 g/dL — AB (ref 13.0–17.0)
MCH: 29.1 pg (ref 26.0–34.0)
MCHC: 34.2 g/dL (ref 30.0–36.0)
MCV: 85 fL (ref 78.0–100.0)
Platelets: 141 10*3/uL — ABNORMAL LOW (ref 150–400)
RBC: 3.47 MIL/uL — AB (ref 4.22–5.81)
RDW: 11.8 % (ref 11.5–15.5)
WBC: 9.5 10*3/uL (ref 4.0–10.5)

## 2013-06-09 MED ORDER — DSS 100 MG PO CAPS: 100.0000 mg | ORAL_CAPSULE | Freq: Two times a day (BID) | ORAL | Status: AC | PRN

## 2013-06-09 MED ORDER — ASPIRIN 325 MG PO TABS: 325.0000 mg | ORAL_TABLET | Freq: Every day | ORAL | Status: AC

## 2013-06-09 MED ORDER — METHOCARBAMOL 750 MG PO TABS: 1500.0000 mg | ORAL_TABLET | Freq: Three times a day (TID) | ORAL | Status: AC | PRN

## 2013-06-09 MED ORDER — POLYETHYLENE GLYCOL 3350 17 G PO PACK: 17.0000 g | PACK | Freq: Every day | ORAL | Status: AC | PRN

## 2013-06-09 MED ORDER — OXYCODONE HCL 10 MG PO TABS: 10.0000 mg | ORAL_TABLET | Freq: Four times a day (QID) | ORAL | Status: AC | PRN

## 2013-06-09 NOTE — Progress Notes (Signed)
HHRN/PT called to [facility] rep, Corrie DandyMary at [phone] according to pt's [employer] insurance network requirement.  Rx placed with pt's chart. Assigned RN aware.  DME orders faxed to [facility] today 1530.
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
## 2013-06-09 NOTE — Clinical Social Work Note (Signed)
Clinical Social Worker continuing to follow patient and family for support and discharge planning needs.  CSW met with patient at bedside who is agreeable with discharge to [place] [place] notified.  CM to arrange home health needs for patient at the [facility] contacted pastoral and left message for home address.    Patient requested contact be made with his brother Marcy Siren [phone]) regarding discharge plans.  CSW spoke with patient brother to provide updates.  Patient brother states that he was able to get clothing and luggage from the car and would bring to patient at bedside for discharge.  Patient brother feels that they will all be able to stay in the home with adequate safe space.  Patient brother will contact patient car insurance on Monday (03/16) regarding the possibility of rental car.  Patient and patient brother both adamant that they will not be going back to [place] until patient mother is ready to discharge.  CSW to work with pastoral regarding patient family ability to remain at [facility].  Clinical Social Worker will be available over the weekend if needs arise prior to discharge [phone]).  Barbette Or, [place]
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
## 2013-06-09 NOTE — Progress Notes (Signed)
UR completed.  Glanda Spanbauer, RN BSN MHA CCM Trauma/Neuro ICU Case Manager [phone]  

## 2013-06-09 NOTE — Progress Notes (Signed)
A little bit better. Working more with therapy. IS much improved - 2250cc. Working with social work for hopeful discharge 3/14 to [facility]plain service house. Patient examined and I agree with the assessment and plan  Violeta GelinasBurke Janelle Culton, MD, MPH, FACS Trauma: [phone] General Surgery: [phone]  06/09/2013 3:44 PM
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
## 2013-06-09 NOTE — Discharge Instructions (Signed)
Knee immobilizer for ambulation and may be removed when at rest.   May have gentle active range of motion of knee.   Weight bear as tolerated on left lower extremity with walker.   Ice packs to thigh as needed for comfort. Dressing change every other day or as needed.  May shower in one week in no drainage from wounds.  Keep wounds covered at all times.

## 2013-06-10 LAB — CBC
HEMATOCRIT: 29 % — AB (ref 39.0–52.0)
Hemoglobin: 9.8 g/dL — ABNORMAL LOW (ref 13.0–17.0)
MCH: 28.6 pg (ref 26.0–34.0)
MCHC: 33.8 g/dL (ref 30.0–36.0)
MCV: 84.5 fL (ref 78.0–100.0)
Platelets: 161 10*3/uL (ref 150–400)
RBC: 3.43 MIL/uL — AB (ref 4.22–5.81)
RDW: 12.1 % (ref 11.5–15.5)
WBC: 8.6 10*3/uL (ref 4.0–10.5)

## 2013-06-10 NOTE — Progress Notes (Signed)
Patient ID: Jonathan Bradshaw, male   DOB: 08/04/1970, 43 y.o.   MRN: [id number] I spoke with Mr. Jonathan Bradshaw and changed his dressing at the bedside.  All wounds on his left leg look good.  He has been up with therapy in a knee immobilizer and only attempting partial weight I suspect due to his pain.  According to the notes, full weight bearing is being allowed.  From an Ortho standpoint, he can get his incisions wet daily in the shower followed by new dry dressings.  He should wear his knee immobilizer when up, but can take it off periodically during the day.  He will need to have his staples removed 2 weeks post-op.
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
## 2013-06-10 NOTE — Progress Notes (Signed)
Discharge instructions gone over with patient and family. Home medications gone over. Prescriptions given. Follow [place] [facility] [place] [facility]. Diet, activity, incisional care, signs and symptoms of worsening condition gone over with patient and who to call. Home equipment was delivered to room and patient's brother [facility] patient is staying while other family members are being discharged, awaiting travel plans back to [place]. [facility] [place]. Patient verbalized understanding of instructions.

## 2013-06-27 ENCOUNTER — Telehealth (INDEPENDENT_AMBULATORY_CARE_PROVIDER_SITE_OTHER): Payer: Self-pay | Admitting: General Surgery

## 2013-06-27 NOTE — Telephone Encounter (Signed)
Caitlyn, and [facility] nurse, called to let us know that she was scheduled to go to this patient's house for home health and the patient has now moved back home to [place].  [facility] is now discharging this patient from home health.

## 2014-01-08 ENCOUNTER — Telehealth (HOSPITAL_COMMUNITY): Payer: Self-pay | Admitting: Emergency Medicine

## 2014-01-08 NOTE — Telephone Encounter (Signed)
Left a message on pvt VM referring Debbe OdeaLatisha to medical records who will provide such records.
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
